# Patient Record
Sex: Female | Born: 1960 | Race: White | Hispanic: No | Smoking: Never smoker
Health system: Southern US, Community
[De-identification: ages and names within clinical notes are randomized; demographics above are authoritative.]

## PROBLEM LIST (undated history)

## (undated) DIAGNOSIS — E669 Obesity, unspecified: Secondary | ICD-10-CM

## (undated) DIAGNOSIS — Z5181 Encounter for therapeutic drug level monitoring: Secondary | ICD-10-CM

## (undated) DIAGNOSIS — G8929 Other chronic pain: Secondary | ICD-10-CM

## (undated) DIAGNOSIS — M5030 Other cervical disc degeneration, unspecified cervical region: Secondary | ICD-10-CM

## (undated) DIAGNOSIS — N63 Unspecified lump in unspecified breast: Secondary | ICD-10-CM

## (undated) HISTORY — DX: Other chronic pain: G89.29

## (undated) HISTORY — DX: Obesity, unspecified: E66.9

## (undated) HISTORY — DX: Encounter for therapeutic drug level monitoring: Z51.81

## (undated) HISTORY — DX: Other cervical disc degeneration, unspecified cervical region: M50.30

---

## 2003-02-10 ENCOUNTER — Encounter: Payer: Self-pay | Admitting: Neurosurgery

## 2003-02-10 ENCOUNTER — Encounter: Payer: Self-pay | Admitting: Anesthesiology

## 2003-02-10 ENCOUNTER — Ambulatory Visit (HOSPITAL_COMMUNITY): Admission: RE | Admit: 2003-02-10 | Discharge: 2003-02-10 | Payer: Self-pay | Admitting: Anesthesiology

## 2003-10-28 ENCOUNTER — Other Ambulatory Visit: Admission: RE | Admit: 2003-10-28 | Discharge: 2003-10-28 | Payer: Self-pay | Admitting: Obstetrics and Gynecology

## 2004-07-19 ENCOUNTER — Encounter (INDEPENDENT_AMBULATORY_CARE_PROVIDER_SITE_OTHER): Payer: Self-pay | Admitting: Specialist

## 2004-07-20 ENCOUNTER — Inpatient Hospital Stay (HOSPITAL_COMMUNITY): Admission: RE | Admit: 2004-07-20 | Discharge: 2004-07-22 | Payer: Self-pay | Admitting: Obstetrics and Gynecology

## 2006-08-21 HISTORY — PX: CERVICAL FUSION: SHX112

## 2006-11-22 ENCOUNTER — Ambulatory Visit (HOSPITAL_COMMUNITY): Admission: RE | Admit: 2006-11-22 | Discharge: 2006-11-23 | Payer: Self-pay | Admitting: Orthopaedic Surgery

## 2007-01-21 ENCOUNTER — Encounter: Admission: RE | Admit: 2007-01-21 | Discharge: 2007-01-21 | Payer: Self-pay | Admitting: Obstetrics and Gynecology

## 2009-06-28 ENCOUNTER — Encounter
Admission: RE | Admit: 2009-06-28 | Discharge: 2009-08-17 | Payer: Self-pay | Admitting: Physical Medicine & Rehabilitation

## 2009-06-29 ENCOUNTER — Ambulatory Visit: Payer: Self-pay | Admitting: Physical Medicine & Rehabilitation

## 2009-07-12 ENCOUNTER — Ambulatory Visit: Payer: Self-pay | Admitting: Physical Medicine & Rehabilitation

## 2009-07-26 ENCOUNTER — Ambulatory Visit: Payer: Self-pay | Admitting: Physical Medicine & Rehabilitation

## 2009-08-12 ENCOUNTER — Ambulatory Visit: Payer: Self-pay | Admitting: Physical Medicine & Rehabilitation

## 2009-08-23 ENCOUNTER — Encounter
Admission: RE | Admit: 2009-08-23 | Discharge: 2009-09-21 | Payer: Self-pay | Admitting: Physical Medicine & Rehabilitation

## 2009-08-24 ENCOUNTER — Ambulatory Visit: Payer: Self-pay | Admitting: Physical Medicine & Rehabilitation

## 2009-09-21 ENCOUNTER — Encounter
Admission: RE | Admit: 2009-09-21 | Discharge: 2009-12-20 | Payer: Self-pay | Admitting: Physical Medicine & Rehabilitation

## 2009-09-23 ENCOUNTER — Ambulatory Visit: Payer: Self-pay | Admitting: Physical Medicine & Rehabilitation

## 2009-10-28 ENCOUNTER — Ambulatory Visit: Payer: Self-pay | Admitting: Physical Medicine & Rehabilitation

## 2009-12-17 ENCOUNTER — Ambulatory Visit: Payer: Self-pay | Admitting: Physical Medicine & Rehabilitation

## 2010-01-06 ENCOUNTER — Encounter
Admission: RE | Admit: 2010-01-06 | Discharge: 2010-04-06 | Payer: Self-pay | Admitting: Physical Medicine & Rehabilitation

## 2010-01-12 ENCOUNTER — Ambulatory Visit: Payer: Self-pay | Admitting: Physical Medicine & Rehabilitation

## 2010-02-16 ENCOUNTER — Ambulatory Visit: Payer: Self-pay | Admitting: Physical Medicine & Rehabilitation

## 2010-02-17 ENCOUNTER — Ambulatory Visit: Payer: Self-pay | Admitting: Physical Medicine & Rehabilitation

## 2010-03-28 ENCOUNTER — Ambulatory Visit: Payer: Self-pay | Admitting: Physical Medicine & Rehabilitation

## 2010-04-04 ENCOUNTER — Encounter
Admission: RE | Admit: 2010-04-04 | Discharge: 2010-04-04 | Payer: Self-pay | Admitting: Physical Medicine & Rehabilitation

## 2010-04-28 ENCOUNTER — Encounter
Admission: RE | Admit: 2010-04-28 | Discharge: 2010-07-27 | Payer: Self-pay | Source: Home / Self Care | Admitting: Physical Medicine & Rehabilitation

## 2010-05-03 ENCOUNTER — Ambulatory Visit: Payer: Self-pay | Admitting: Physical Medicine & Rehabilitation

## 2010-05-31 ENCOUNTER — Ambulatory Visit: Payer: Self-pay | Admitting: Physical Medicine & Rehabilitation

## 2010-06-28 ENCOUNTER — Ambulatory Visit: Payer: Self-pay | Admitting: Physical Medicine & Rehabilitation

## 2010-08-24 ENCOUNTER — Encounter
Admission: RE | Admit: 2010-08-24 | Discharge: 2010-09-20 | Payer: Self-pay | Source: Home / Self Care | Attending: Physical Medicine & Rehabilitation | Admitting: Physical Medicine & Rehabilitation

## 2010-08-26 ENCOUNTER — Ambulatory Visit
Admission: RE | Admit: 2010-08-26 | Discharge: 2010-08-26 | Payer: Self-pay | Source: Home / Self Care | Attending: Physical Medicine & Rehabilitation | Admitting: Physical Medicine & Rehabilitation

## 2010-09-19 ENCOUNTER — Other Ambulatory Visit: Payer: Self-pay | Admitting: Physical Medicine & Rehabilitation

## 2010-09-20 ENCOUNTER — Other Ambulatory Visit: Payer: Self-pay | Admitting: Physical Medicine & Rehabilitation

## 2010-09-20 DIAGNOSIS — M25551 Pain in right hip: Secondary | ICD-10-CM

## 2010-09-21 ENCOUNTER — Encounter: Payer: Self-pay | Admitting: Physical Medicine & Rehabilitation

## 2010-09-21 ENCOUNTER — Ambulatory Visit
Admission: RE | Admit: 2010-09-21 | Discharge: 2010-09-21 | Disposition: A | Discharge status: Home / Self Care | Payer: Medicaid Other | Source: Ambulatory Visit | Attending: Physical Medicine & Rehabilitation | Admitting: Physical Medicine & Rehabilitation

## 2010-09-21 DIAGNOSIS — M25551 Pain in right hip: Secondary | ICD-10-CM

## 2010-09-28 ENCOUNTER — Encounter: Payer: Medicaid Other | Attending: Physical Medicine & Rehabilitation

## 2010-09-28 ENCOUNTER — Ambulatory Visit (HOSPITAL_BASED_OUTPATIENT_CLINIC_OR_DEPARTMENT_OTHER): Payer: Medicaid Other

## 2010-09-28 DIAGNOSIS — M25559 Pain in unspecified hip: Secondary | ICD-10-CM

## 2010-09-28 DIAGNOSIS — M545 Low back pain: Secondary | ICD-10-CM

## 2010-09-28 DIAGNOSIS — R209 Unspecified disturbances of skin sensation: Secondary | ICD-10-CM | POA: Insufficient documentation

## 2010-09-28 DIAGNOSIS — M5137 Other intervertebral disc degeneration, lumbosacral region: Secondary | ICD-10-CM

## 2010-09-28 DIAGNOSIS — IMO0001 Reserved for inherently not codable concepts without codable children: Secondary | ICD-10-CM | POA: Insufficient documentation

## 2010-09-28 DIAGNOSIS — IMO0002 Reserved for concepts with insufficient information to code with codable children: Secondary | ICD-10-CM | POA: Insufficient documentation

## 2010-09-28 DIAGNOSIS — M47817 Spondylosis without myelopathy or radiculopathy, lumbosacral region: Secondary | ICD-10-CM

## 2010-09-28 DIAGNOSIS — M542 Cervicalgia: Secondary | ICD-10-CM | POA: Insufficient documentation

## 2010-09-28 DIAGNOSIS — M961 Postlaminectomy syndrome, not elsewhere classified: Secondary | ICD-10-CM | POA: Insufficient documentation

## 2010-10-31 ENCOUNTER — Encounter: Payer: Medicaid Other | Attending: Physical Medicine & Rehabilitation

## 2010-10-31 ENCOUNTER — Ambulatory Visit (HOSPITAL_BASED_OUTPATIENT_CLINIC_OR_DEPARTMENT_OTHER): Payer: Medicaid Other | Admitting: Physical Medicine & Rehabilitation

## 2010-10-31 DIAGNOSIS — M48061 Spinal stenosis, lumbar region without neurogenic claudication: Secondary | ICD-10-CM

## 2010-10-31 DIAGNOSIS — IMO0001 Reserved for inherently not codable concepts without codable children: Secondary | ICD-10-CM | POA: Insufficient documentation

## 2010-10-31 DIAGNOSIS — M542 Cervicalgia: Secondary | ICD-10-CM | POA: Insufficient documentation

## 2010-10-31 DIAGNOSIS — M961 Postlaminectomy syndrome, not elsewhere classified: Secondary | ICD-10-CM | POA: Insufficient documentation

## 2010-10-31 DIAGNOSIS — R209 Unspecified disturbances of skin sensation: Secondary | ICD-10-CM | POA: Insufficient documentation

## 2010-10-31 DIAGNOSIS — IMO0002 Reserved for concepts with insufficient information to code with codable children: Secondary | ICD-10-CM | POA: Insufficient documentation

## 2010-10-31 DIAGNOSIS — M25559 Pain in unspecified hip: Secondary | ICD-10-CM

## 2010-11-29 ENCOUNTER — Encounter: Payer: Medicare Other | Attending: Neurosurgery | Admitting: Neurosurgery

## 2010-11-29 ENCOUNTER — Ambulatory Visit: Payer: Medicaid Other | Admitting: Neurosurgery

## 2010-11-29 DIAGNOSIS — M542 Cervicalgia: Secondary | ICD-10-CM

## 2010-11-29 DIAGNOSIS — G894 Chronic pain syndrome: Secondary | ICD-10-CM | POA: Insufficient documentation

## 2010-11-29 DIAGNOSIS — M545 Low back pain, unspecified: Secondary | ICD-10-CM | POA: Insufficient documentation

## 2010-11-29 DIAGNOSIS — Z981 Arthrodesis status: Secondary | ICD-10-CM | POA: Insufficient documentation

## 2010-11-29 DIAGNOSIS — R5381 Other malaise: Secondary | ICD-10-CM | POA: Insufficient documentation

## 2010-11-29 DIAGNOSIS — IMO0002 Reserved for concepts with insufficient information to code with codable children: Secondary | ICD-10-CM | POA: Insufficient documentation

## 2010-11-29 DIAGNOSIS — F341 Dysthymic disorder: Secondary | ICD-10-CM | POA: Insufficient documentation

## 2010-11-29 DIAGNOSIS — M5430 Sciatica, unspecified side: Secondary | ICD-10-CM

## 2010-11-29 DIAGNOSIS — R5383 Other fatigue: Secondary | ICD-10-CM | POA: Insufficient documentation

## 2010-11-29 DIAGNOSIS — R209 Unspecified disturbances of skin sensation: Secondary | ICD-10-CM | POA: Insufficient documentation

## 2010-12-13 ENCOUNTER — Ambulatory Visit (HOSPITAL_COMMUNITY)
Admission: RE | Admit: 2010-12-13 | Discharge: 2010-12-13 | Disposition: A | Discharge status: Home / Self Care | Payer: Medicare Other | Source: Ambulatory Visit | Attending: Physical Medicine & Rehabilitation | Admitting: Physical Medicine & Rehabilitation

## 2010-12-13 ENCOUNTER — Other Ambulatory Visit: Payer: Self-pay | Admitting: Physical Medicine & Rehabilitation

## 2010-12-13 DIAGNOSIS — R52 Pain, unspecified: Secondary | ICD-10-CM

## 2010-12-13 DIAGNOSIS — M25559 Pain in unspecified hip: Secondary | ICD-10-CM | POA: Insufficient documentation

## 2010-12-29 ENCOUNTER — Encounter: Payer: Medicare Other | Attending: Neurosurgery | Admitting: Neurosurgery

## 2010-12-29 DIAGNOSIS — M5430 Sciatica, unspecified side: Secondary | ICD-10-CM

## 2010-12-29 DIAGNOSIS — IMO0001 Reserved for inherently not codable concepts without codable children: Secondary | ICD-10-CM | POA: Insufficient documentation

## 2010-12-29 DIAGNOSIS — M48061 Spinal stenosis, lumbar region without neurogenic claudication: Secondary | ICD-10-CM

## 2010-12-29 DIAGNOSIS — M7989 Other specified soft tissue disorders: Secondary | ICD-10-CM | POA: Insufficient documentation

## 2010-12-29 DIAGNOSIS — M545 Low back pain, unspecified: Secondary | ICD-10-CM | POA: Insufficient documentation

## 2010-12-29 DIAGNOSIS — G894 Chronic pain syndrome: Secondary | ICD-10-CM

## 2010-12-29 DIAGNOSIS — M533 Sacrococcygeal disorders, not elsewhere classified: Secondary | ICD-10-CM | POA: Insufficient documentation

## 2010-12-29 DIAGNOSIS — M79609 Pain in unspecified limb: Secondary | ICD-10-CM | POA: Insufficient documentation
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
## 2011-01-02 ENCOUNTER — Other Ambulatory Visit: Payer: Self-pay | Admitting: Physical Medicine & Rehabilitation

## 2011-01-02 DIAGNOSIS — M545 Low back pain, unspecified: Secondary | ICD-10-CM

## 2011-01-02 DIAGNOSIS — M25559 Pain in unspecified hip: Secondary | ICD-10-CM

## 2011-01-02 DIAGNOSIS — M79609 Pain in unspecified limb: Secondary | ICD-10-CM

## 2011-01-02 DIAGNOSIS — M5410 Radiculopathy, site unspecified: Secondary | ICD-10-CM

## 2011-01-06 NOTE — Op Note (Signed)
Mallory Leonard, Mallory Leonard               ACCOUNT NO.:  [id number]   MEDICAL RECORD NO.:  [id number]          PATIENT TYPE:  OBV   LOCATION:  9399                          FACILITY:  [facility]   PHYSICIAN:  Miguel Aschoff, M.D.       DATE OF BIRTH:  02/25/1961   DATE OF PROCEDURE:  07/19/2004  DATE OF DISCHARGE:                                 OPERATIVE REPORT   PREOPERATIVE DIAGNOSES:  1.  Chronic pelvic pain.  2.  Uterine fibroids.  3.  History of endometriosis.   POSTOPERATIVE DIAGNOSES:  1.  Chronic pelvic pain.  2.  Uterine fibroids.  3.  History of endometriosis.   PROCEDURES:  1.  Examination under anesthesia.  2.  Total abdominal hysterectomy.  3.  Lysis of adhesions.   SURGEON:  Miguel Aschoff, M.D.   ANESTHESIA:  General.   COMPLICATIONS:  None.   BRIEF HISTORY:  The patient is a 50 year old white female with a history of  dysmenorrhea, dyspareunia, uterine fibroids, and a previous history of  endometriosis.  Because of her symptomatology, the patient had initially  been treated with Lupron Depot therapy in an effort to control her symptoms;  however, after completion of the therapy, she had recurrence of her severe  pelvic pain, dyspareunia, and dysmenorrhea, and requested a definitive  procedure to be carried out.  Ultrasound examination prior to surgery  revealed the presence of uterine fibroids, and the plan is for the patient  to undergo evaluation under anesthesia and if it is felt to proceed with  LAVH, this will be attempted.  If not, a total abdominal hysterectomy and  possible bilateral salpingo-oophorectomy will be performed.  All options  were discussed with the patient concerning the route and extent of the  surgery.  Risks and benefits have been discussed.  Informed consent has been  obtained.   PROCEDURE:  The patient was taken to the operating room and placed in the  supine position, and general anesthesia was administered without difficulty.  She was then placed  in modified lithotomy position, and examination under  anesthesia was carried out.  This revealed a globular uterus approximately  10 weeks' equivalent size.  No definite adnexal masses were noted.  The  uterus was relatively immobile and because of the examination under  anesthesia, it was felt best to proceed with abdominal hysterectomy rather  than laparoscopically-assisted hysterectomy.  At this point the patient was  placed in the supine position, a Foley catheter was inserted, then a  Pfannenstiel incision was made, extended down through the subcutaneous  tissue with bleeding points being clamped and coagulated as they were  encountered.  The fascia was then identified, incised transversely, and then  separated from the underlying rectus muscles.  Rectus muscles were divided  in the midline.  The peritoneum was the found and entered, carefully  avoiding underlying structures.  The peritoneal incision was then extended  under direct visualization.  A self-retaining retractor was placed through  the wound, viscera were packed out of the pelvis.  At this point, inspection  of the pelvic organs again revealed  the uterus to be globular, approximately  10 weeks' equivalent size.  There was a small subserosal myoma present.  There were adhesions of the sigmoid colon to the posterior surface of the  uterus.  These were mostly filmy in nature.  The ovaries appeared to be  within normal limits.  The cul-de-sac was inspected, and there was no  endometriosis noted in the cul-de-sac.  A small left paratubal cyst was  noted.  At this point the round ligaments were identified, clamped, cut, and  suture ligated using suture ligatures of 0 Vicryl.  A bladder flap was then  created anteriorly.  Then preparations were made below the utero-ovarian  ligaments.  These ligaments were clamped with curved Heaney clamps, pedicles  cut and suture ligated using suture ligatures of 0 Vicryl.  Then the broad   ligament was skeletonized, the uterine vessels identified, and these were  then clamped with curved Heaney clamps.  These pedicles were cut and suture  ligated using suture ligatures of 0 Vicryl.  Then in serial fashion using  straight Heaney clamps, the paracervical fascia was clamped, cut, and suture  ligated.  This continued until the uterosacral ligaments were clamped with  curved Heaney clamps.  These were cut and suture ligated using suture  ligatures of 0 Vicryl.  Then the cardinal ligaments were clamped, cut, and  suture ligated using suture ligatures of 0 Vicryl.  At this point it was  possible to clamp across the reflection of the vagina onto the cervix, and  the specimen was excised, specimen consisting of the cervix and the uterus.  At this point figure-of-eight sutures were placed in the angles of the  vaginal cuff.  Then the cuff was closed using running interlocking 0 Vicryl  suture.  Inspection made for hemostasis.  Hemostasis appeared to be  excellent.  At this point the pelvic floor was reperitonealized and the  ovaries were elevated and sutured to the remnant of the round ligament to  try to keep the ovaries out of the area of the vaginal cuff.  After this was  done, the abdomen was irrigated with warm saline and lap counts and  instrument counts were taken and found to be correct, and then the abdomen  was closed.  The parietal peritoneum was closed using running continuous 0  Vicryl suture.  Rectus muscles were reapproximated using running continuous  0 Vicryl suture.  The fascia was closed using two sutures of 0 Vicryl, each  starting at the lateral fascial angles and meeting in the midline.  Subcutaneous tissue and skin were closed using staples.  The estimated blood  loss from the procedure was approximately 100 mL.  The patient tolerated the  procedure well and went to the recovery room in satisfactory condition.      AR/MEDQ  D:  07/19/2004  T:  07/19/2004  Job:   [id number]

## 2011-01-06 NOTE — H&P (Signed)
Mallory Leonard, Mallory Leonard               ACCOUNT NO.:  [id number]   MEDICAL RECORD NO.:  [id number]          PATIENT TYPE:  OIB   LOCATION:  5028                         FACILITY:  [facility]   PHYSICIAN:  Sharolyn Douglas, M.D.        DATE OF BIRTH:  [birth year]   DATE OF ADMISSION:  11/22/2006  DATE OF DISCHARGE:  11/23/2006                              HISTORY & PHYSICAL   CHIEF COMPLAINT:  Neck and right upper extremity pain.   HISTORY OF PRESENT ILLNESS:  The patient is a 50 year old-female who has  been having increasing neck and right upper extremity pain.  She  unfortunately has not responded to conservative treatments.  She was  found to have a cervical spondylosis from C5-C7.  The risks and benefits  of the above ACDF were discussed with the patient at length by Dr. Noel Gerold  as well as myself.  She indicated understanding and opted to proceed.   ALLERGIES:  NONE.   MEDICATIONS:  Norco 10 mg.   PAST MEDICAL HISTORY:  Cervical spondylosis.   PAST SURGICAL HISTORY:  Hysterectomy and right breast lift and  appendectomy.   SOCIAL HISTORY:  The patient denies tobacco use.  Denies alcohol use.  She is divorced.   FAMILY HISTORY:  Noncontributory.   REVIEW OF SYSTEMS:  Negative.   PHYSICAL EXAMINATION:  GENERAL APPEARANCE:  The patient is a 50 year old  white female who is alert and oriented in no acute distress.  Well-  nourished, well-groomed, appeared stated age.  Pleasant and cooperative  to this exam.  HEENT:  Head is normocephalic, atraumatic.  Pupils equal, round,  reactive to light.  Extraocular movements are intact.  Nares patent.  Oropharynx clear.  NECK:  Soft to palpation.  No lymphadenopathy, thyromegaly.  No bruits  appreciated.  CHEST:  Clear to auscultation bilaterally.  No rales, rhonchi, stridor,  wheezes, or friction rubs.  HEART:  S1, S2.  Regular rate and rhythm.  No murmurs, gallops, or rubs  noted.  ABDOMEN:  Soft to palpation, nontender, nondistended.  No  organomegaly.  GU:  Not pertinent; not performed.  EXTREMITIES:  As per HPI.  SKIN:  Intact.  No lesions or rashes.   IMPRESSION:  Cervical spondylosis.   PLAN:  Admit to [facility] on November 22, 2006, for ACDF of C5-C7.  Surgery will be done by Dr. Noel Gerold.      Verlin Fester, P.A.      Sharolyn Douglas, M.D.  Electronically Signed    CM/MEDQ  D:  11/28/2006  T:  11/28/2006  Job:  [id number]

## 2011-01-06 NOTE — Discharge Summary (Signed)
[name], [name]               ACCOUNT NO.:  [id number]   MEDICAL RECORD NO.:  [id number]          PATIENT TYPE:  INP   LOCATION:  9320                          FACILITY:  [facility]   PHYSICIAN:  Miguel Aschoff, M.D.       DATE OF BIRTH:  1961/03/30   DATE OF ADMISSION:  07/19/2004  DATE OF DISCHARGE:  07/22/2004                                 DISCHARGE SUMMARY   ADMISSION DIAGNOSES:  1.  Chronic pelvic pain.  2.  Endometriosis.   FINAL DIAGNOSES:  1.  Chronic pelvic pain.  2.  Endometriosis.  3.  Leiomyomata.  4.  Serosal fibrous adhesions.   OPERATIONS AND PROCEDURES:  Total abdominal hysterectomy.   BRIEF HISTORY:  The patient is a 50 year old white female with history of  increasing dysmenorrhea and dyspareunia as well as uterine fibroids.  She  does have a previous history of endometriosis.  The patient had been treated  conservatively with Lupron Depot.  However, on completion of her Lupron  therapy the patient had a recurrence of her severe pelvic pain, dyspareunia,  and dysmenorrhea.  Because of these symptoms, she now requests a definitive  procedure be carried out and was admitted to the hospital at this time to  undergo total abdominal hysterectomy, possible bilateral salpingo-  oophorectomy.  Preoperative studies were obtained.  This included admission  hemoglobin of 13.2, hematocrit of 8.5.  PT and PTT were within normal  limits.  Chemistry profile was also within normal limits.   HOSPITAL COURSE:  On July 19, 2004 the patient was taken to the  operating room where under general anesthesia an examination was carried out  and it was felt at that time that rather than proceed with laparoscopic-  assisted hysterectomy that total abdominal hysterectomy was indicated by the  clinical examination.  This was carried out and revealed a globular uterus  approximately 8 weeks in size.  Ovaries appear to be normal.  In addition,  she had pelvic adhesions.  The hysterectomy  was carried out without  difficulty, as well as the lysis of adhesions, and the patient tolerated the  procedure well.  Postoperative course was complicated by a drop in her  hemoglobin to 7.5; however, the patient remained clinically stable and this  was treated with iron therapy.  By postoperative day #3, the patient was in  satisfactory condition and felt stable enough to be discharged home.  Medications for home included Tylox one q.3h. as needed for pain, Chromagen  iron tablets one daily.  She was instructed to do no heavy lifting; to place  nothing in the vagina; and to call if there are any problems such as fever,  pain or heavy bleeding.  She was sent  home on a regular diet in satisfactory condition.  Final pathology report  revealed a 187-gram uterus with a nonsecretory endometrium, a leiomyomata,  serous fibrous adhesions.  The patient is to be seen back in 4 weeks for  follow-up examination and she understands to call if there are any  postoperative problems.      AR/MEDQ  D:  08/15/2004  T:  08/15/2004  Job:  [id number]

## 2011-01-06 NOTE — Op Note (Signed)
Mallory Leonard, Mallory Leonard               ACCOUNT NO.:  [id number]   MEDICAL RECORD NO.:  [id number]          PATIENT TYPE:  AMB   LOCATION:  SDS                          FACILITY:  [facility]   PHYSICIAN:  Sharolyn Douglas, M.D.        DATE OF BIRTH:  09-Jul-1961   DATE OF PROCEDURE:  11/22/2006  DATE OF DISCHARGE:                               OPERATIVE REPORT   DIAGNOSIS:  Cervical spondylotic radiculopathy.   PROCEDURE:  1. Anterior cervical diskectomy C5-6 and C6-7 with decompression of      the spinal cord and nerve roots bilaterally.  2. Anterior cervical arthrodesis C5-6 and C6-7 with placement of two      allograft prosthesis spacers packed with local autogenous bone      graft.  3. Anterior cervical plating C5-C7 using the Abbott spine system.   SURGEON:  Sharolyn Douglas, M.D.   ASSISTANT:  Verlin Fester, P.A.   ANESTHESIA:  General endotracheal.   ESTIMATED BLOOD LOSS:  Minimal.   COMPLICATIONS:  None.   INDICATIONS:  The patient is a pleasant 50 year old female with chronic  persistent neck and primarily right greater than left upper extremity  pain.  Imaging studies show severe cervical spondylosis and osteophyte  formation worse on the right side at C5-6 and C6-7 with foraminal  narrowing.  She now presents for ACDF at the above levels in the hopes  of improving her symptoms.  Risk, benefits, and alternatives were  reviewed.   DESCRIPTION OF PROCEDURE:  After informed consent she was taken to the  operating room.  She underwent general endotracheal anesthesia without  difficulty.  She was given prophylactic IV antibiotics.  She was  carefully positioned supine on the operating room table with the  Mayfield head rest; and the neck in slight extension.  The neck was  prepped and draped in the usual sterile fashion.  A small transverse  incision was made on the left of the neck at the level cricoid  cartilage.  Dissection was carried sharply through the platysma; and the  area between  the SCM and strap muscles medially was developed down the  prevertebral space.  The esophagus, trachea, and carotid sheath were  identified and protected at all times.  A spinal needle was placed at C6-  7 and intraoperative x-ray taken to confirm this level.   We then exposed the C5-6 and C6-7 levels by elevating the longus coli  muscle.  The shadow line retractor was placed.  The microscope was  draped and brought into the field.  Caspar distraction pins were placed  in the C5, C6, and C7 vertebral bodies.  Gentle distraction was applied.  Starting at C5-6 a radical diskectomy was carried back into the  posterior longitudinal ligament.  The disk was degenerative.  The  cartilaginous endplates were prepared for the arthrodesis.  The high-  speed bur was used to take down the posterior vertebral margins as well  as the uncovertebral joints.  This bone was saved for later  autografting.  Then a 2-mm Kerrison punch used to take down the  posterior  longitudinal ligament.  We then used the 1-mm Kerrison punch  to undercut and remove the osteophytes primarily on the right side.  We  found the foramen was stenotic; and this was opened up.  We also  performed a foraminotomy on the left side.  We then placed a 5-mm, and a  5-mm allograft prosthesis spacer which had been packed with the local  autogenous bone graft collected earlier.  This was countersunk 1 mm.  We  then performed a similar procedure at C6-7.  Again the posterior  vertebral margins were taken down, the uncovertebral joints were  debrided, and the foraminotomies were completed.  At this level.  We  utilized a 6-mm allograft prosthesis spacer, again, packed with the  local autogenous bone graft.  We placed a 38-mm anterior cervical plate  from Z6-X0.  We used six 12-mm screws.  The bone quality was good, and  the screw purchase was adequate.  We ensured that the locking mechanism  engaged.  The esophagus, trachea, and carotid sheath  were examined.  There were no apparent injuries.  Hemostasis was achieved.  Intraoperative x-ray was taken showing good position of the  instrumentation from C5-C7.  A TLS drain was left in place.  The  platysma was closed with interrupted 2-0 Vicryl, subcutaneous layer  closed with interrupted 3-0 Vicryl, followed by a running 4-0  subcuticular Vicryl suture on the skin edges.  Dermabond was applied.  The cervical collar placed.  The patient was extubated without  difficulty, and transferred to recovery in stable condition.   It should be noted my assistant [facility], PA was present throughout  procedure including the exposure, the decompression, the fusion, the  instrumentation; and she also assisted with wound closure.      Sharolyn Douglas, M.D.  Electronically Signed     MC/MEDQ  D:  11/22/2006  T:  11/22/2006  Job:  [id number]

## 2011-01-07 ENCOUNTER — Ambulatory Visit (HOSPITAL_COMMUNITY)
Admission: RE | Admit: 2011-01-07 | Discharge: 2011-01-07 | Disposition: A | Discharge status: Home / Self Care | Payer: Medicare Other | Source: Ambulatory Visit | Attending: Physical Medicine & Rehabilitation | Admitting: Physical Medicine & Rehabilitation

## 2011-01-07 DIAGNOSIS — M129 Arthropathy, unspecified: Secondary | ICD-10-CM | POA: Insufficient documentation

## 2011-01-07 DIAGNOSIS — N83209 Unspecified ovarian cyst, unspecified side: Secondary | ICD-10-CM | POA: Insufficient documentation

## 2011-01-07 DIAGNOSIS — M545 Low back pain, unspecified: Secondary | ICD-10-CM | POA: Insufficient documentation

## 2011-01-07 DIAGNOSIS — M51379 Other intervertebral disc degeneration, lumbosacral region without mention of lumbar back pain or lower extremity pain: Secondary | ICD-10-CM | POA: Insufficient documentation

## 2011-01-07 DIAGNOSIS — R262 Difficulty in walking, not elsewhere classified: Secondary | ICD-10-CM | POA: Insufficient documentation

## 2011-01-07 DIAGNOSIS — M25559 Pain in unspecified hip: Secondary | ICD-10-CM | POA: Insufficient documentation

## 2011-01-07 DIAGNOSIS — M79609 Pain in unspecified limb: Secondary | ICD-10-CM

## 2011-01-07 DIAGNOSIS — M5137 Other intervertebral disc degeneration, lumbosacral region: Secondary | ICD-10-CM | POA: Insufficient documentation

## 2011-01-07 DIAGNOSIS — M76899 Other specified enthesopathies of unspecified lower limb, excluding foot: Secondary | ICD-10-CM | POA: Insufficient documentation

## 2011-01-30 ENCOUNTER — Encounter: Payer: Medicare Other | Attending: Neurosurgery | Admitting: Neurosurgery

## 2011-01-30 DIAGNOSIS — M542 Cervicalgia: Secondary | ICD-10-CM | POA: Insufficient documentation

## 2011-01-30 DIAGNOSIS — M545 Low back pain, unspecified: Secondary | ICD-10-CM | POA: Insufficient documentation

## 2011-01-30 DIAGNOSIS — M5430 Sciatica, unspecified side: Secondary | ICD-10-CM

## 2011-01-30 DIAGNOSIS — M25519 Pain in unspecified shoulder: Secondary | ICD-10-CM | POA: Insufficient documentation

## 2011-01-30 DIAGNOSIS — G8929 Other chronic pain: Secondary | ICD-10-CM | POA: Insufficient documentation

## 2011-01-30 DIAGNOSIS — M25559 Pain in unspecified hip: Secondary | ICD-10-CM

## 2011-01-31 NOTE — Assessment & Plan Note (Signed)
[name] returns today, is the patient of Dr. Pamelia Hoit, is following up for some low back pain.  She also has some shoulder pain and neck pain.  She tells me that she is tired of taking the Opana that she has been on for sometime and she wants to wean off of that.  She does not want anymore pain medicines.  I told her I felt that was admirable and she can do so if she should.  She rates her pain on an average about 10, it was constant, and it is in her back and neck and then right hip and buttocks.  Activity level is very little.  Sleep patterns are poor. Pain is worse in the morning and night.  Most all activities aggravate, medication tend to help a little, pacing activities help.  She walks without assistance.  She only walk about 15 minutes.  She can drive and climb stairs.  REVIEW OF SYSTEMS:  Notable for those difficulties as well as some constipation, some limb swelling.  She has some confusion, depression, anxiety at times.  No signs of aberrant behavior.  Her Oswestry score today is 70.  PAST MEDICAL HISTORY:  Unchanged.  SOCIAL HISTORY:  Unchanged.  PHYSICAL EXAMINATION:  VITAL SIGNS:  Blood pressure 157/81, pulse 73, respirations 18, O2 sats 98 on room air. MUSCULOSKELETAL:  Her motor strength is good in the lower extremities, unchanged.  Sensation is intact.  She does have pain on palpation to the neck and back. PSYCHIATRIC:  Constitutionally, she is within normal limits.  She is oriented x3.  She is bright and alert.  Does seem somewhat depressed.  ASSESSMENT:  Chronic low back and neck pain with right radicular pain in the shoulder as well as the hip.  RADIOGRAPHS:  We did obtain an MRI of the right hip that was negative for any acute findings, mild degeneration of both hips.  We also obtained an MRI of lumbar spine that again shows some multilevel pathology with a 2-3 foraminal protrusion that could irritate L2 nerve root.  I discussed this with the patient,  her questions are referred to neurosurgeon.  PLAN: 1. We will go ahead and refer her to Dr. Delma Officer, at [facility]. 2. I discussed with her weaning off her Opana going from twice a day,     for this week twice 1 day.  Once the next and rotate like it     through this weekend and then next week go to one a day for a week     and then come off of it or either go to the third week to one every     other day.  She does understand she will have some withdrawal, she     understands this.  Her questions were encouraged and answered.  We     will see her back here in 3 months.     Vrinda Heckstall L. Blima Dessert Electronically Signed    RLW/MedQ D:  01/30/2011 14:15:29  T:  01/31/2011 05:18:25  Job #:  [id number]

## 2011-03-06 ENCOUNTER — Other Ambulatory Visit: Payer: Self-pay | Admitting: Neurosurgery

## 2011-03-06 DIAGNOSIS — M542 Cervicalgia: Secondary | ICD-10-CM

## 2011-03-13 ENCOUNTER — Encounter: Payer: Medicare Other | Attending: Neurosurgery | Admitting: Neurosurgery

## 2011-03-13 ENCOUNTER — Ambulatory Visit: Payer: Medicare Other | Admitting: Physical Medicine and Rehabilitation

## 2011-03-13 DIAGNOSIS — M542 Cervicalgia: Secondary | ICD-10-CM | POA: Insufficient documentation

## 2011-03-13 DIAGNOSIS — M25519 Pain in unspecified shoulder: Secondary | ICD-10-CM | POA: Insufficient documentation

## 2011-03-13 DIAGNOSIS — M545 Low back pain, unspecified: Secondary | ICD-10-CM | POA: Insufficient documentation

## 2011-03-13 DIAGNOSIS — R209 Unspecified disturbances of skin sensation: Secondary | ICD-10-CM | POA: Insufficient documentation

## 2011-03-13 DIAGNOSIS — M25559 Pain in unspecified hip: Secondary | ICD-10-CM | POA: Insufficient documentation

## 2011-03-14 NOTE — Assessment & Plan Note (Signed)
[name] is a patient of Dr. Wynn Banker, is followed for multiple pain complaints.  She states she has got no new problems.  She did talk to her psychologist about her discussion with me in coming off her pain medicine.  She states that she was encouraged to not put herself through that and she wishes to switch back to the Opana from hydrocodone.  She rates her pain about a 10, it is constant pain.  It is stabbing and aching with spasms in her low back up to her neck.  General activity level is 10.  Pain same 24 hours a day.  Sleep patterns are poor.  All activities aggravate.  Pacing activities, medication tend to help.  She walks without assistance.  She can drive and climb steps.  She can walk about 15 minutes at a time.  She is on disability.  REVIEW OF SYSTEMS:  Notable for the difficulties described above as well as weight loss, night sweats, limb swelling, weakness, paresthesias, trouble with ambulation, confusion, depression, or anxiety.  No suicidal thoughts or aberrant behaviors.  PAST MEDICAL HISTORY:  Unchanged.  SOCIAL HISTORY:  She is married, single and lives alone.  FAMILY HISTORY:  Unchanged.  PHYSICAL EXAMINATION:  VITAL SIGNS:  Blood pressure 156/93, pulse 92, respirations 18, and O2 saturation is 98% on room air. CONSTITUTIONAL:  She is within normal limits. NEUROLOGIC:  Her affect is bright and alert.  She does walk with somewhat of a limp.  Her motor strength is fair in lower extremities about 4/5.  Sensation is intact.  Again, there are new problems. Oswestry score is 72.  ASSESSMENT: 1. Chronic low back pain. 2. Cervicalgia. 3. Right radicular pain in the shoulders as well as hip.  PLAN: 1. We discussed her appointment Dr. Lovell Sheehan at [facility].  He does not have anything to offer surgically in the lumbar     spine.  He is going to obtain MRI of the cervical spine to see her     back.  She will follow up with him regarding that. 2.  Opana 10 mg ER one p.o. b.i.d., #60 with no refill.  We will     discontinue the hydrocodone 5/325 one p.o. q.8 h.  She will finish     her prescription of that and then restart the Opana.  Her questions     were encouraged and answered.  We will see her back in 1 month.     Griffon Herberg L. Blima Dessert Electronically Signed    RLW/MedQ D:  03/13/2011 15:41:30  T:  03/14/2011 02:56:37  Job #:  [id number]

## 2011-03-20 ENCOUNTER — Ambulatory Visit
Admission: RE | Admit: 2011-03-20 | Discharge: 2011-03-20 | Disposition: A | Discharge status: Home / Self Care | Payer: Medicare Other | Source: Ambulatory Visit | Attending: Neurosurgery | Admitting: Neurosurgery

## 2011-03-20 DIAGNOSIS — M542 Cervicalgia: Secondary | ICD-10-CM

## 2011-04-10 ENCOUNTER — Ambulatory Visit: Payer: Medicare Other | Admitting: Neurosurgery

## 2011-04-17 ENCOUNTER — Encounter: Payer: Medicare Other | Admitting: Neurosurgery

## 2011-04-21 ENCOUNTER — Encounter: Payer: Medicare Other | Attending: Neurosurgery | Admitting: Neurosurgery

## 2011-04-21 DIAGNOSIS — G894 Chronic pain syndrome: Secondary | ICD-10-CM

## 2011-04-21 DIAGNOSIS — M545 Low back pain, unspecified: Secondary | ICD-10-CM | POA: Insufficient documentation

## 2011-04-21 DIAGNOSIS — IMO0002 Reserved for concepts with insufficient information to code with codable children: Secondary | ICD-10-CM | POA: Insufficient documentation

## 2011-04-21 DIAGNOSIS — M25519 Pain in unspecified shoulder: Secondary | ICD-10-CM | POA: Insufficient documentation

## 2011-04-21 DIAGNOSIS — M542 Cervicalgia: Secondary | ICD-10-CM | POA: Insufficient documentation

## 2011-04-21 DIAGNOSIS — M25559 Pain in unspecified hip: Secondary | ICD-10-CM | POA: Insufficient documentation

## 2011-04-21 NOTE — Assessment & Plan Note (Signed)
Account [id number].  Mallory Leonard is a patient Dr. Wynn Banker who is seen for multiple pain complaints as well as shoulder and neck pain, hip pain, and leg pain. She reports no change today except for she is having some increased leg and arm cramping.  She states she had some rectal bleeding from time to time.  She had a colonoscopy that was basically negative, and she has to go back for formal followup on that next week.  She also states that her primary care doctor needs to do some blood work which I reiterated to her to check her electrolyte levels and general blood counts to make sure that she is not low and this is cause her cramping.  She rates her pain at 9, it is intermittent, it is a tingling, aching type pain. General activity level is 10.  Pain is worse in the evening and at night.  Sleep patterns are poor.  All activities aggravate her pain, medication, TENS unit tends to help.  She can walk about 15 minutes at a time.  Functionally, she is unemployed.  REVIEW OF SYSTEMS:  Notable for those difficulties described above as well as some constipation, nausea, limb swelling, confusion, depression, anxiety, no suicidal thoughts, or aberrant behaviors.  She is having some night sweats and weight gain which may be contributed to by menopause.  Her past medical history is unchanged.  SOCIAL HISTORY:  She is single, lives alone.  FAMILY HISTORY:  Unchanged.  PHYSICAL EXAMINATION:  Her blood pressure is 168/101, pulse 75, respirations 18, O2 sats 100 on room air.  Motor strength is 5/5 in iliopsoas, quadriceps, confrontational testing.  Sensation is intact in the upper and lower extremities.  Constitutionally she is within normal limits.  She is alert and oriented x3.  She has a normal gait, although she does limp somewhat when she is up and about.  ASSESSMENT: 1. Chronic low back pain. 2. Cervicalgia. 3. Right radicular pain.  She also has shoulder pain as well as some     hip pain  that is transient.  PLAN: 1. She will follow up with her GI and primary care regarding her     bleeding issues. 2. Refill Opana ER 10 mg one p.o. t.i.d. 60 with no refill. 3. She will follow up here in the clinic in 1 month, her questions     were encouraged and answered.     Rickey Farrier L. Blima Dessert Electronically Signed    RLW/MedQ D:  04/21/2011 13:01:38  T:  04/21/2011 19:20:34  Job #:  [id number]

## 2011-05-17 ENCOUNTER — Encounter: Payer: Medicare Other | Admitting: Neurosurgery

## 2011-05-19 ENCOUNTER — Ambulatory Visit: Payer: Medicare Other | Admitting: Neurosurgery

## 2011-05-29 ENCOUNTER — Encounter: Payer: Medicare Other | Attending: Neurosurgery | Admitting: Neurosurgery

## 2011-05-29 DIAGNOSIS — M545 Low back pain, unspecified: Secondary | ICD-10-CM | POA: Insufficient documentation

## 2011-05-29 DIAGNOSIS — M79609 Pain in unspecified limb: Secondary | ICD-10-CM | POA: Insufficient documentation

## 2011-05-29 DIAGNOSIS — G894 Chronic pain syndrome: Secondary | ICD-10-CM

## 2011-05-29 DIAGNOSIS — M542 Cervicalgia: Secondary | ICD-10-CM

## 2011-05-29 DIAGNOSIS — G8929 Other chronic pain: Secondary | ICD-10-CM | POA: Insufficient documentation

## 2011-05-30 NOTE — Assessment & Plan Note (Signed)
This is a patient of Dr. Wynn Banker, seen for multiple pain complaints mostly in her back and right lower extremity pain.  She states that her pain today is 10, it is sharp, dull, stabbing to aching type pain.  It is unchanged, but she feels like it has increased somewhat in intensity in the low back.  Pain is worse in the morning and evening.  Sleep patterns are poor.  Pain is worse with all activities.  Medication, TENS unit help some.  She can climb steps and drive.  She can walk about 15 minutes at a time.  Functionally, some help with bathing, household duties, and shopping.  REVIEW OF SYSTEMS:  Notable for difficulties as described above as well as some weight gain, night sweats, weakness, trouble walking, spasms, confusion, depression anxiety, limb swelling, abdominal pain, no suicidal thoughts, or aberrant behaviors.  Pill counts correct. Oswestry score is 68.  Last UDS was consistent.  PAST MEDICAL HISTORY, SOCIAL HISTORY, AND FAMILY HISTORY:  Unchanged.  PHYSICAL EXAMINATION:  VITAL SIGNS:  Her blood pressure is 155/93, pulse 89, respirations 18, O2 sats 100% on room air. MUSCULOSKELETAL:  Motor strength is 5/5 to confrontation testing in the lower extremities.  Sensation is intact. GENERAL:  Constitutionally, she is within normal limits. NEUROLOGIC:  She is alert and oriented x3, somewhat depressed, and walks with a limp.  ASSESSMENT: 1. Chronic low back pain. 2. Cervicalgia. 3. Right radicular pain, lower extremity.  PLAN: 1. She is down to one medicine now.  She has taken Opana, we are going     to refill that Opana ER 10 mg 1 p.o. b.i.d. 60 with no refill. 2. Toradol 60 mg to the left upper buttock area.  If that does not     help, we are going to possibly look into a lower extremity EMG     nerve conduction study. For injection, she is going to come back     and see Dr. Wynn Banker, next appointment.  Her pain distribution     appears to be in the L2-3 nerve root.   She does have some right     groin pain.  She has pain with internal-external rotation of the     right leg which could be the nerve root given the fact that her     last x-rays and MRI of the pelvis were normal.  We will see her     back in 4-6 weeks. Her questions were encouraged and answered.     Yosgar Demirjian L. Blima Dessert Electronically Signed    RLW/MedQ D:  05/29/2011 14:59:15  T:  05/30/2011 02:33:08  Job #:  [id number]

## 2011-06-30 ENCOUNTER — Ambulatory Visit: Payer: Medicare Other | Admitting: Physical Medicine & Rehabilitation

## 2014-04-23 ENCOUNTER — Other Ambulatory Visit: Payer: Self-pay | Admitting: Obstetrics and Gynecology

## 2014-04-24 LAB — CYTOLOGY - PAP

## 2015-05-12 DIAGNOSIS — M5030 Other cervical disc degeneration, unspecified cervical region: Secondary | ICD-10-CM

## 2015-05-12 HISTORY — DX: Other cervical disc degeneration, unspecified cervical region: M50.30

## 2015-11-25 ENCOUNTER — Encounter: Payer: Self-pay | Admitting: Physical Medicine & Rehabilitation

## 2016-12-25 ENCOUNTER — Other Ambulatory Visit: Payer: Self-pay | Admitting: Obstetrics & Gynecology

## 2016-12-25 DIAGNOSIS — N6320 Unspecified lump in the left breast, unspecified quadrant: Secondary | ICD-10-CM

## 2016-12-27 ENCOUNTER — Ambulatory Visit
Admission: RE | Admit: 2016-12-27 | Discharge: 2016-12-27 | Disposition: A | Discharge status: Home / Self Care | Payer: Medicare Other | Source: Ambulatory Visit | Attending: Obstetrics & Gynecology | Admitting: Obstetrics & Gynecology

## 2016-12-27 ENCOUNTER — Other Ambulatory Visit: Payer: Self-pay | Admitting: Obstetrics & Gynecology

## 2016-12-27 DIAGNOSIS — N6320 Unspecified lump in the left breast, unspecified quadrant: Secondary | ICD-10-CM

## 2016-12-27 HISTORY — DX: Unspecified lump in unspecified breast: N63.0

## 2017-06-07 DIAGNOSIS — R002 Palpitations: Secondary | ICD-10-CM

## 2017-09-20 ENCOUNTER — Encounter: Payer: Self-pay | Admitting: Internal Medicine

## 2017-09-28 ENCOUNTER — Ambulatory Visit: Payer: Medicare Other | Admitting: Internal Medicine

## 2017-10-11 NOTE — Progress Notes (Signed)
Cardiology Office Note   Date:  10/19/2017   ID:  Mallory Leonard, DOB 1961/07/17, MRN [id number]  PCP:  Caffie Damme, MD  Cardiologist:   Charlton Haws, MD   No chief complaint on file.      History of Present Illness: Mallory Leonard is a 57 y.o. female who presents for evaluation of elevated BP on 3 drugs. Also exertional dyspnea She has chronic pain syndrome and obesity On oxycodone and Adipex Phentermine stopped Her daughter died Unexpectedly in 08/03/2023 Was a travel nurse ? Pneumonia and respiratory collapse. She is raising her two children Has had palpitations for a month or so Not exertional more at night. No syncope. Dyspnea as well She seems to  Sigh and take a deep breath while talking. She has no asthma, non smoker No signs CHF or edema has cut meds back on her own for BP only on ACE not no norvasc or diuretic    LDL 122 HDL 78 TC 232 LDL-P 1022 A1c 5.9 TSH 2.9 K 4.4 Cr .92   Past Medical History:  Diagnosis Date  . Breast mass   . Chronic pain   . DDD (degenerative disc disease), cervical 05/12/2015   WITH RADICULAR PAIN INTO R SI JOINT.  Marland Kitchen Encounter for therapeutic drug level monitoring   . Obese     Past Surgical History:  Procedure Laterality Date  . CERVICAL FUSION  2008   WITH WORSENING OF NECK PAIN     Current Outpatient Medications  Medication Sig Dispense Refill  . atorvastatin (LIPITOR) 40 MG tablet Take 40 mg by mouth daily.    Marland Kitchen lisinopril (PRINIVIL,ZESTRIL) 20 MG tablet Take 20 mg by mouth daily.    . Oxycodone HCl 10 MG TABS Take 10 mg by mouth 4 (four) times daily as needed.     No current facility-administered medications for this visit.     Allergies:   Tylenol [acetaminophen]    Social History:  The patient  reports that  has never smoked. she has never used smokeless tobacco. She reports that she does not drink alcohol or use drugs.   Family History:  The patient's family history is not on file.    ROS:  Please see the history of  present illness.   Otherwise, review of systems are positive for none.   All other systems are reviewed and negative.    PHYSICAL EXAM: VS:  BP (!) 142/84   Pulse 72   Ht 5\' 6"  (1.676 m)   Wt 176 lb (79.8 kg)   SpO2 99%   BMI 28.41 kg/m  , BMI Body mass index is 28.41 kg/m. Affect appropriate Healthy:  appears stated age HEENT: normal Neck supple with no adenopathy JVP normal no bruits no thyromegaly Lungs clear with no wheezing and good diaphragmatic motion Heart:  S1/S2 no murmur, no rub, gallop or click PMI normal Abdomen: benighn, BS positve, no tenderness, no AAA no bruit.  No HSM or HJR Distal pulses intact with no bruits No edema Neuro non-focal Skin warm and dry No muscular weakness    EKG:  06/07/17 SR rate 68 normal    Recent Labs: No results found for requested labs within last 8760 hours.    Lipid Panel No results found for: CHOL, TRIG, HDL, CHOLHDL, VLDL, LDLCALC, LDLDIRECT    Wt Readings from Last 3 Encounters:  10/19/17 176 lb (79.8 kg)      Other studies Reviewed: Additional studies/ records that were reviewed today include: Notes  from primary labs .    ASSESSMENT AND PLAN:  1.  Palpitations benign sounding will get event monitor  2. Dsypnea normal ECG and exam f/u echo 3. Depression: likely from daughters untimely death and stress of bringing her two children up consider SSRI 4. HLD  Continue statin labs with primary   Current medicines are reviewed at length with the patient today.  The patient does not have concerns regarding medicines.  The following changes have been made:  no change  Labs/ tests ordered today include: Event monitor and echo   Orders Placed This Encounter  Procedures  . CARDIAC EVENT MONITOR  . EKG 12-Lead  . ECHOCARDIOGRAM COMPLETE     Disposition:   FU with cardiology PRN      Signed, Charlton HawsPeter Nishan, MD  10/19/2017 10:49 AM    [facility] [address], [place], [place]   [id number] Phone: [phone]; Fax: [phone]

## 2017-10-19 ENCOUNTER — Ambulatory Visit (INDEPENDENT_AMBULATORY_CARE_PROVIDER_SITE_OTHER): Payer: Medicare Other | Admitting: Cardiovascular Disease

## 2017-10-19 ENCOUNTER — Encounter (INDEPENDENT_AMBULATORY_CARE_PROVIDER_SITE_OTHER): Payer: Self-pay

## 2017-10-19 ENCOUNTER — Encounter: Payer: Self-pay | Admitting: Cardiovascular Disease

## 2017-10-19 VITALS — BP 142/84 | HR 72 | Ht 66.0 in | Wt 176.0 lb

## 2017-10-19 DIAGNOSIS — R002 Palpitations: Secondary | ICD-10-CM

## 2017-10-19 DIAGNOSIS — Z5181 Encounter for therapeutic drug level monitoring: Secondary | ICD-10-CM | POA: Insufficient documentation

## 2017-10-19 DIAGNOSIS — R0600 Dyspnea, unspecified: Secondary | ICD-10-CM

## 2017-10-19 DIAGNOSIS — N63 Unspecified lump in unspecified breast: Secondary | ICD-10-CM | POA: Insufficient documentation

## 2017-10-19 DIAGNOSIS — E669 Obesity, unspecified: Secondary | ICD-10-CM | POA: Insufficient documentation

## 2017-10-19 DIAGNOSIS — G8929 Other chronic pain: Secondary | ICD-10-CM | POA: Insufficient documentation

## 2017-10-19 NOTE — Patient Instructions (Signed)
Medication Instructions:  Your physician recommends that you continue on your current medications as directed. Please refer to the Current Medication list given to you today.  Labwork: NONE  Testing/Procedures: Your physician has requested that you have an echocardiogram. Echocardiography is a painless test that uses sound waves to create images of your heart. It provides your doctor with information about the size and shape of your heart and how well your heart's chambers and valves are working. This procedure takes approximately one hour. There are no restrictions for this procedure.  Your physician has recommended that you wear an event monitor. Event monitors are medical devices that record the heart's electrical activity. Doctors most often us these monitors to diagnose arrhythmias. Arrhythmias are problems with the speed or rhythm of the heartbeat. The monitor is a small, portable device. You can wear one while you do your normal daily activities. This is usually used to diagnose what is causing palpitations/syncope (passing out).  Follow-Up: Your physician wants you to follow-up as needed with Dr. Eden EmmsNishan.   If you need a refill on your cardiac medications before your next appointment, please call your pharmacy.

## 2017-10-31 ENCOUNTER — Other Ambulatory Visit (HOSPITAL_COMMUNITY): Payer: Medicare Other

## 2017-11-22 ENCOUNTER — Other Ambulatory Visit: Payer: Self-pay

## 2017-11-22 ENCOUNTER — Ambulatory Visit (INDEPENDENT_AMBULATORY_CARE_PROVIDER_SITE_OTHER): Payer: Medicare Other

## 2017-11-22 ENCOUNTER — Ambulatory Visit (HOSPITAL_COMMUNITY): Payer: Medicare Other | Attending: Cardiology

## 2017-11-22 ENCOUNTER — Telehealth: Payer: Self-pay

## 2017-11-22 DIAGNOSIS — I081 Rheumatic disorders of both mitral and tricuspid valves: Secondary | ICD-10-CM | POA: Diagnosis not present

## 2017-11-22 DIAGNOSIS — R002 Palpitations: Secondary | ICD-10-CM | POA: Diagnosis not present

## 2017-11-22 DIAGNOSIS — R0600 Dyspnea, unspecified: Secondary | ICD-10-CM

## 2017-11-22 NOTE — Telephone Encounter (Signed)
   [facility] Pre-operative Risk Assessment    Request for surgical clearance:  1. What type of surgery is being performed? Dental treatment- may include cleaning, radiographs, fillings and extractions   2. When is this surgery scheduled? None at this time.     3. What type of clearance is required (medical clearance vs. Pharmacy clearance to hold med vs. Both)? Medical clearance  4. Are there any medications that need to be held prior to surgery and how long? Antibiotic prophylaxis (yes or no) Interruption of anticoagulants (yes or no)  Anesthetic Restrictions (yes or no)  Is Epinephrine okay (yes or no)  5. Practice name and name of physician performing surgery? [facility], Dr. Alcide Clever   6. What is your office phone and fax number? Phone- [phone] [phone]   Anesthesia type (None, local, MAC, general) ? Local with epinephrine  Mallory Leonard 11/22/2017, 2:10 PM  _________________________________________________________________ On request it states- "Patient has indicated the following medical conditions: 1-Patient reported dx of uncontrolled angina. 2- Patient on nitroglycerin Rx?

## 2017-11-22 NOTE — Telephone Encounter (Signed)
    Patient seen by Dr Eden EmmsNishan is a new patient 10/19/2017.  Echocardiogram and event monitor ordered.  Results on these tests are not available at this time.  Follow-up on the testing results.   Ask Dr Eden EmmsNishan to address cardiac clearance once test results are available.  Clearance is for dental procedures requiring local anesthesia with epinephrine.  Theodore Demarkhonda Rillie Riffel, PA-C 11/22/2017 5:21 PM Beeper [phone]

## 2017-11-23 NOTE — Telephone Encounter (Signed)
Faxed clearance to fax number provided.

## 2017-11-23 NOTE — Telephone Encounter (Signed)
She is ok to have dental procedure Echo was normal

## 2018-10-28 ENCOUNTER — Other Ambulatory Visit: Payer: Self-pay | Admitting: Orthopaedic Surgery

## 2018-10-28 ENCOUNTER — Telehealth: Payer: Self-pay | Admitting: Nurse Practitioner

## 2018-10-28 DIAGNOSIS — M542 Cervicalgia: Secondary | ICD-10-CM

## 2018-10-28 NOTE — Telephone Encounter (Signed)
Phone call to patient to verify medication list and allergies for myelogram procedure. Pt aware she will not need to hold any medications for this procedure. 

## 2018-11-08 ENCOUNTER — Other Ambulatory Visit: Payer: Medicare Other

## 2018-12-13 ENCOUNTER — Inpatient Hospital Stay: Admission: RE | Admit: 2018-12-13 | Payer: Medicare Other | Source: Ambulatory Visit

## 2019-01-06 ENCOUNTER — Telehealth: Payer: Self-pay | Admitting: Nurse Practitioner

## 2019-01-06 NOTE — Telephone Encounter (Signed)
Phone call to patient to verify medication list and allergies for myelogram procedure. Pt aware she will not need to hold any medications for this procedure. 

## 2019-02-10 ENCOUNTER — Other Ambulatory Visit: Payer: Medicare Other

## 2019-02-12 ENCOUNTER — Other Ambulatory Visit: Payer: Medicare Other

## 2019-03-03 ENCOUNTER — Other Ambulatory Visit: Payer: Medicare Other

## 2019-05-05 ENCOUNTER — Ambulatory Visit
Admission: RE | Admit: 2019-05-05 | Discharge: 2019-05-05 | Disposition: A | Discharge status: Home / Self Care | Payer: Medicare Other | Source: Ambulatory Visit | Attending: Orthopaedic Surgery | Admitting: Orthopaedic Surgery

## 2019-05-05 DIAGNOSIS — M542 Cervicalgia: Secondary | ICD-10-CM

## 2019-05-05 MED ORDER — ONDANSETRON HCL 4 MG/2ML IJ SOLN
4.0000 mg | Freq: Once | INTRAMUSCULAR | Status: AC
  Administered 2019-05-05: 10:00:00 4 mg via INTRAMUSCULAR

## 2019-05-05 MED ORDER — IOPAMIDOL (ISOVUE-M 300) INJECTION 61%
10.0000 mL | Freq: Once | INTRAMUSCULAR | Status: AC | PRN
  Administered 2019-05-05: 10:00:00 10 mL via INTRATHECAL

## 2019-05-05 MED ORDER — DIAZEPAM 5 MG PO TABS
10.0000 mg | ORAL_TABLET | Freq: Once | ORAL | Status: AC
  Administered 2019-05-05: 09:00:00 10 mg via ORAL

## 2019-05-05 MED ORDER — MEPERIDINE HCL 50 MG/ML IJ SOLN
50.0000 mg | Freq: Once | INTRAMUSCULAR | Status: AC
  Administered 2019-05-05: 10:00:00 50 mg via INTRAMUSCULAR

## 2019-05-05 NOTE — Discharge Instructions (Signed)

## 2019-05-05 NOTE — Discharge Instr - Other Orders (Signed)
0945- pt reports achy pain in back and neck from procedure. MD ordered Pain meds. See MAR 1000- pt reports pain has subsided. Resting comfortably.

## 2019-12-15 ENCOUNTER — Other Ambulatory Visit: Payer: Self-pay | Admitting: Oncology

## 2020-06-24 ENCOUNTER — Emergency Department (HOSPITAL_COMMUNITY)
Admission: EM | Admit: 2020-06-24 | Discharge: 2020-06-24 | Disposition: A | Discharge status: Home / Self Care | Payer: Medicare Other | Attending: Emergency Medicine | Admitting: Emergency Medicine

## 2020-06-24 ENCOUNTER — Emergency Department (HOSPITAL_COMMUNITY): Payer: Medicare Other

## 2020-06-24 DIAGNOSIS — R002 Palpitations: Secondary | ICD-10-CM | POA: Diagnosis not present

## 2020-06-24 DIAGNOSIS — R350 Frequency of micturition: Secondary | ICD-10-CM | POA: Diagnosis not present

## 2020-06-24 DIAGNOSIS — R0789 Other chest pain: Secondary | ICD-10-CM | POA: Diagnosis not present

## 2020-06-24 DIAGNOSIS — R079 Chest pain, unspecified: Secondary | ICD-10-CM

## 2020-06-24 DIAGNOSIS — R319 Hematuria, unspecified: Secondary | ICD-10-CM | POA: Insufficient documentation

## 2020-06-24 DIAGNOSIS — R110 Nausea: Secondary | ICD-10-CM | POA: Diagnosis not present

## 2020-06-24 DIAGNOSIS — R519 Headache, unspecified: Secondary | ICD-10-CM | POA: Insufficient documentation

## 2020-06-24 LAB — CBC
HCT: 43.4 % (ref 36.0–46.0)
Hemoglobin: 14.5 g/dL (ref 12.0–15.0)
MCH: 30.5 pg (ref 26.0–34.0)
MCHC: 33.4 g/dL (ref 30.0–36.0)
MCV: 91.2 fL (ref 80.0–100.0)
Platelets: 276 10*3/uL (ref 150–400)
RBC: 4.76 MIL/uL (ref 3.87–5.11)
RDW: 13 % (ref 11.5–15.5)
WBC: 9.1 10*3/uL (ref 4.0–10.5)
nRBC: 0 % (ref 0.0–0.2)

## 2020-06-24 LAB — BASIC METABOLIC PANEL
Anion gap: 12 (ref 5–15)
BUN: 18 mg/dL (ref 6–20)
CO2: 26 mmol/L (ref 22–32)
Calcium: 9.4 mg/dL (ref 8.9–10.3)
Chloride: 102 mmol/L (ref 98–111)
Creatinine, Ser: 0.88 mg/dL (ref 0.44–1.00)
GFR, Estimated: >60 mL/min (ref >60)
Glucose, Bld: 101 mg/dL — ABNORMAL HIGH (ref 70–99)
Potassium: 3.8 mmol/L (ref 3.5–5.1)
Sodium: 140 mmol/L (ref 135–145)

## 2020-06-24 LAB — TROPONIN I (HIGH SENSITIVITY)
Troponin I (High Sensitivity): 5 ng/L (ref <18)
Troponin I (High Sensitivity): 4 ng/L (ref <18)

## 2020-06-24 MED ORDER — ONDANSETRON HCL 4 MG PO TABS: 4.0000 mg | ORAL_TABLET | Freq: Three times a day (TID) | ORAL | 0 refills | Status: AC | PRN

## 2020-06-24 MED ORDER — MORPHINE SULFATE (PF) 4 MG/ML IV SOLN: 4.0000 mg | Freq: Once | INTRAVENOUS | Status: DC

## 2020-06-24 MED ORDER — KETOROLAC TROMETHAMINE 30 MG/ML IJ SOLN
30.0000 mg | Freq: Once | INTRAMUSCULAR | Status: DC
  Filled 2020-06-24: qty 1

## 2020-06-24 MED ORDER — IBUPROFEN 600 MG PO TABS: 600.0000 mg | ORAL_TABLET | Freq: Four times a day (QID) | ORAL | 0 refills | Status: AC | PRN

## 2020-06-24 MED ORDER — IBUPROFEN 800 MG PO TABS
800.0000 mg | ORAL_TABLET | Freq: Once | ORAL | Status: AC
  Administered 2020-06-24: 800 mg via ORAL
  Filled 2020-06-24: qty 1

## 2020-06-24 NOTE — ED Provider Notes (Signed)
[facility] EMERGENCY DEPARTMENT Provider Note   CSN: [id number] Arrival date & time: 06/24/20  1033     History Chief Complaint  Patient presents with  . Headache  . Nausea  . Chest Pain    Mallory Leonard is a 59 y.o. female.  The history is provided by the patient. No language interpreter was used.  Headache Chest Pain Associated symptoms: headache      59 year old female significant history of obesity, chronic pain, breast mass, presenting for evaluation of chest pain. Patient report for the past 2 days she has had intermittent urinary discomfort with increased urinary frequency and urgency. She also endorsed having intermittent heart palpitation that lasted for seconds intermittently for the past 2 days as well. This morning she was awoke with headache. She described headache as a throbbing sensation starting the right side of her neck radiates towards the head initially more intense but that has since improved. She does endorse feeling nauseous without any vision changes fever chills runny nose sneezing coughing shortness of breath lightheadedness dizziness having focal numbness or focal weakness. She went to her primary care doctor's office for a schedule yearly exam but did vomit once there. She was given Zofran and was sent here for further evaluation. At this time she mention her headache is 4 out of 10. She also endorsed some mild tenderness to the right side of her abdomen. States she has history of kidney stones in the past but have not noticed any blood in her urine. She does take pain medication on a regular basis for her chronic neck pain. She denies any significant cardiac history. She does not smoke or drink.  Past Medical History:  Diagnosis Date  . Breast mass   . Chronic pain   . DDD (degenerative disc disease), cervical 05/12/2015   WITH RADICULAR PAIN INTO R SI JOINT.  Marland Kitchen Encounter for therapeutic drug level monitoring   . Obese     Patient  Active Problem List   Diagnosis Date Noted  . Obese   . Encounter for therapeutic drug level monitoring   . Chronic pain   . Breast mass   . DDD (degenerative disc disease), cervical 05/12/2015    Past Surgical History:  Procedure Laterality Date  . CERVICAL FUSION  2008   WITH WORSENING OF NECK PAIN     OB History   No obstetric history on file.     No family history on file.  Social History   Tobacco Use  . Smoking status: Never Smoker  . Smokeless tobacco: Never Used  Vaping Use  . Vaping Use: Never used  Substance Use Topics  . Alcohol use: No  . Drug use: No    Home Medications Prior to Admission medications   Medication Sig Start Date End Date Taking? Authorizing Provider  atorvastatin (LIPITOR) 40 MG tablet Take 40 mg by mouth daily.    [provider]  lisinopril (PRINIVIL,ZESTRIL) 20 MG tablet Take 20 mg by mouth daily.    [provider]  Oxycodone HCl 10 MG TABS Take 10 mg by mouth 4 (four) times daily as needed.    [provider]    Allergies    Tylenol [acetaminophen]  Review of Systems   Review of Systems  Cardiovascular: Positive for chest pain.  Neurological: Positive for headaches.  All other systems reviewed and are negative.   Physical Exam Updated Vital Signs BP (!) 162/60 (BP Location: Left Arm)   Pulse 63  Temp 98.4 F (36.9 C) (Oral)   Resp 18   SpO2 96%   Physical Exam Vitals and nursing note reviewed.  Constitutional:      General: She is not in acute distress.    Appearance: She is well-developed.     Comments: Patient resting comfortably appears to be in no acute discomfort.  HENT:     Head: Normocephalic and atraumatic.  Eyes:     Extraocular Movements: Extraocular movements intact.     Conjunctiva/sclera: Conjunctivae normal.     Pupils: Pupils are equal, round, and reactive to light.  Neck:     Comments: No tenderness to palpation of the neck. No carotid bruit. Cardiovascular:      Rate and Rhythm: Normal rate and regular rhythm.     Heart sounds: Normal heart sounds.  Pulmonary:     Effort: Pulmonary effort is normal.     Breath sounds: Normal breath sounds. No wheezing, rhonchi or rales.  Abdominal:     General: Bowel sounds are normal.     Tenderness: There is abdominal tenderness (Mild tenderness to right upper abdomen on palpation without guarding or rebound tenderness. Negative Murphy sign, no pain with McBurney's point).     Comments: No CVA tenderness  Musculoskeletal:     Cervical back: Neck supple. No rigidity.  Skin:    Findings: No rash.  Neurological:     Mental Status: She is alert and oriented to person, place, and time.     GCS: GCS eye subscore is 4. GCS verbal subscore is 5. GCS motor subscore is 6.     Cranial Nerves: No dysarthria.     Sensory: No sensory deficit.     Comments: 5 out of 5 to all 4 extremities.  Psychiatric:        Mood and Affect: Mood normal.     ED Results / Procedures / Treatments   Labs (all labs ordered are listed, but only abnormal results are displayed) Labs Reviewed  BASIC METABOLIC PANEL - Abnormal; Notable for the following components:      Result Value   Glucose, Bld 101 (*)    All other components within normal limits  CBC  URINALYSIS, ROUTINE W REFLEX MICROSCOPIC  TROPONIN I (HIGH SENSITIVITY)  TROPONIN I (HIGH SENSITIVITY)    EKG EKG Interpretation  Date/Time:  Thursday June 24 2020 10:51:31 EDT Ventricular Rate:  68 PR Interval:  150 QRS Duration: 70 QT Interval:  406 QTC Calculation: 431 R Axis:   68 Text Interpretation: Sinus rhythm with marked sinus arrhythmia Nonspecific ST abnormality Abnormal ECG no prior ECG for comparison. No STEMI Confirmed by Theda Belfast ([id number]) on 06/24/2020 11:46:11 AM   Radiology DG Chest 2 View  Result Date: 06/24/2020 CLINICAL DATA:  Chest pain. EXAM: CHEST - 2 VIEW COMPARISON:  July 04, 2012. FINDINGS: The heart size and mediastinal contours are  within normal limits. Both lungs are clear. No visible pleural effusions or pneumothorax. No acute osseous abnormality. Suspected remote/healed right clavicle fracture. Cervical ACDF. IMPRESSION: No active cardiopulmonary disease. Electronically Signed   By: Feliberto Harts MD   On: 06/24/2020 11:12    Procedures Procedures (including critical care time)  Medications Ordered in ED Medications  ibuprofen (ADVIL) tablet 800 mg (800 mg Oral Given 06/24/20 1340)    ED Course  I have reviewed the triage vital signs and the nursing notes.  Pertinent labs & imaging results that were available during my care of the patient were reviewed by me  and considered in my medical decision making (see chart for details).    MDM Rules/Calculators/A&P                          BP (!) 162/60 (BP Location: Left Arm)   Pulse 63   Temp 98.4 F (36.9 C) (Oral)   Resp 18   SpO2 96%   Final Clinical Impression(s) / ED Diagnoses Final diagnoses:  Atypical chest pain  Hematuria, unspecified type    Rx / DC Orders ED Discharge Orders         Ordered    ondansetron (ZOFRAN) 4 MG tablet  Every 8 hours PRN        06/24/20 1342    ibuprofen (ADVIL) 600 MG tablet  Every 6 hours PRN        06/24/20 1342         10:56 AM Patient here with complaints of dysuria as well as having headache, and having intermittent chest discomfort. At this time she is well-appearing appears to be in no acute discomfort. No concerning feature in regards to headache. Chest pain sounds atypical of ACS. Work-up initiated.  1:00 PM UA from urgent care center demonstrate evidence of blood in the urine without signs of urinary tract infection.  Since patient does have some pain to her right side of abdomen possible right flank, will get CT renal stone study to rule out kidney stone.  Has history of kidney stones in the past.  Her EKG here shows sinus rhythm with marked sinus arrhythmia and nonspecific ST abnormalities.  Her initial  troponin is unremarkable.  Awaits delta troponin.  Pain is atypical of ACS.  She has headache without any focal neuro deficit.  Doubt dissection.  Doubt ICH, SAH, or stroke.  Pain medication given.  Care discussed with Dr. Rush Landmark.   1:33 PM I did order a CT renal stone study however patient declined stating that she does not feel that the CT scan is necessary at this time as her pain has improved.  She would prefer to follow-up with her primary care doctor for further care.  She is allergic to Tylenol therefore will give NSAIDs to help her with her symptoms.  Patient is then to return if her condition worsen or if she has any other concern.   Fayrene Helper, PA-C 06/24/20 1345    Tegeler, Canary Brim, MD 06/24/20 1544

## 2020-06-24 NOTE — ED Notes (Signed)
Episode of urinary incontinence while actively vomiting.  Pt changed out of wet clothes and bedding changed.  Pt states wave of nausea has passed after vomiting

## 2020-06-24 NOTE — Discharge Instructions (Addendum)
You have been evaluated for your chest pain.  EKG shows that he has some slightly abnormal heart rhythm/arrhythmia which may account to your heart palpitation.  Follow-up with your primary care doctor for further evaluation.  Your urine did show evidence of blood in the urine, coupled with abdominal discomfort this may be a result of a potential kidney stone.  Talk to your doctor and request for CT scan if your symptoms persist.  Take ibuprofen as needed for pain and Zofran as needed for nausea.  If you develop worsening symptoms please do not hesitate to return to the ER for further evaluation.  Your blood pressure is high today, please have it rechecked.

## 2020-06-24 NOTE — ED Triage Notes (Signed)
Pt arrived by EMS from Urgent care where she has a scheduled appointment with her PCP for chest pain that she has been having intermittently.  This morning was woken up with 7/10 headache that is located on the right side. Pt also c/o nausea with 2 episodes of emisis   Pt denies chest pain at this time.  Was given 4mg  Zofran at UC that helped the nausea.  Alert and oriented x4

## 2021-05-30 ENCOUNTER — Other Ambulatory Visit: Payer: Self-pay | Admitting: Obstetrics & Gynecology

## 2021-05-31 ENCOUNTER — Other Ambulatory Visit: Payer: Self-pay | Admitting: Obstetrics & Gynecology

## 2021-05-31 DIAGNOSIS — M79629 Pain in unspecified upper arm: Secondary | ICD-10-CM

## 2021-08-04 ENCOUNTER — Other Ambulatory Visit: Payer: Medicare Other

## 2022-09-08 IMAGING — CR DG CHEST 2V
2 series · 2 of 2 positions shown · non-contrast
Comparison: July 04, 2012.

CLINICAL DATA: Chest pain.

EXAM:
CHEST - 2 VIEW

[chest pa]
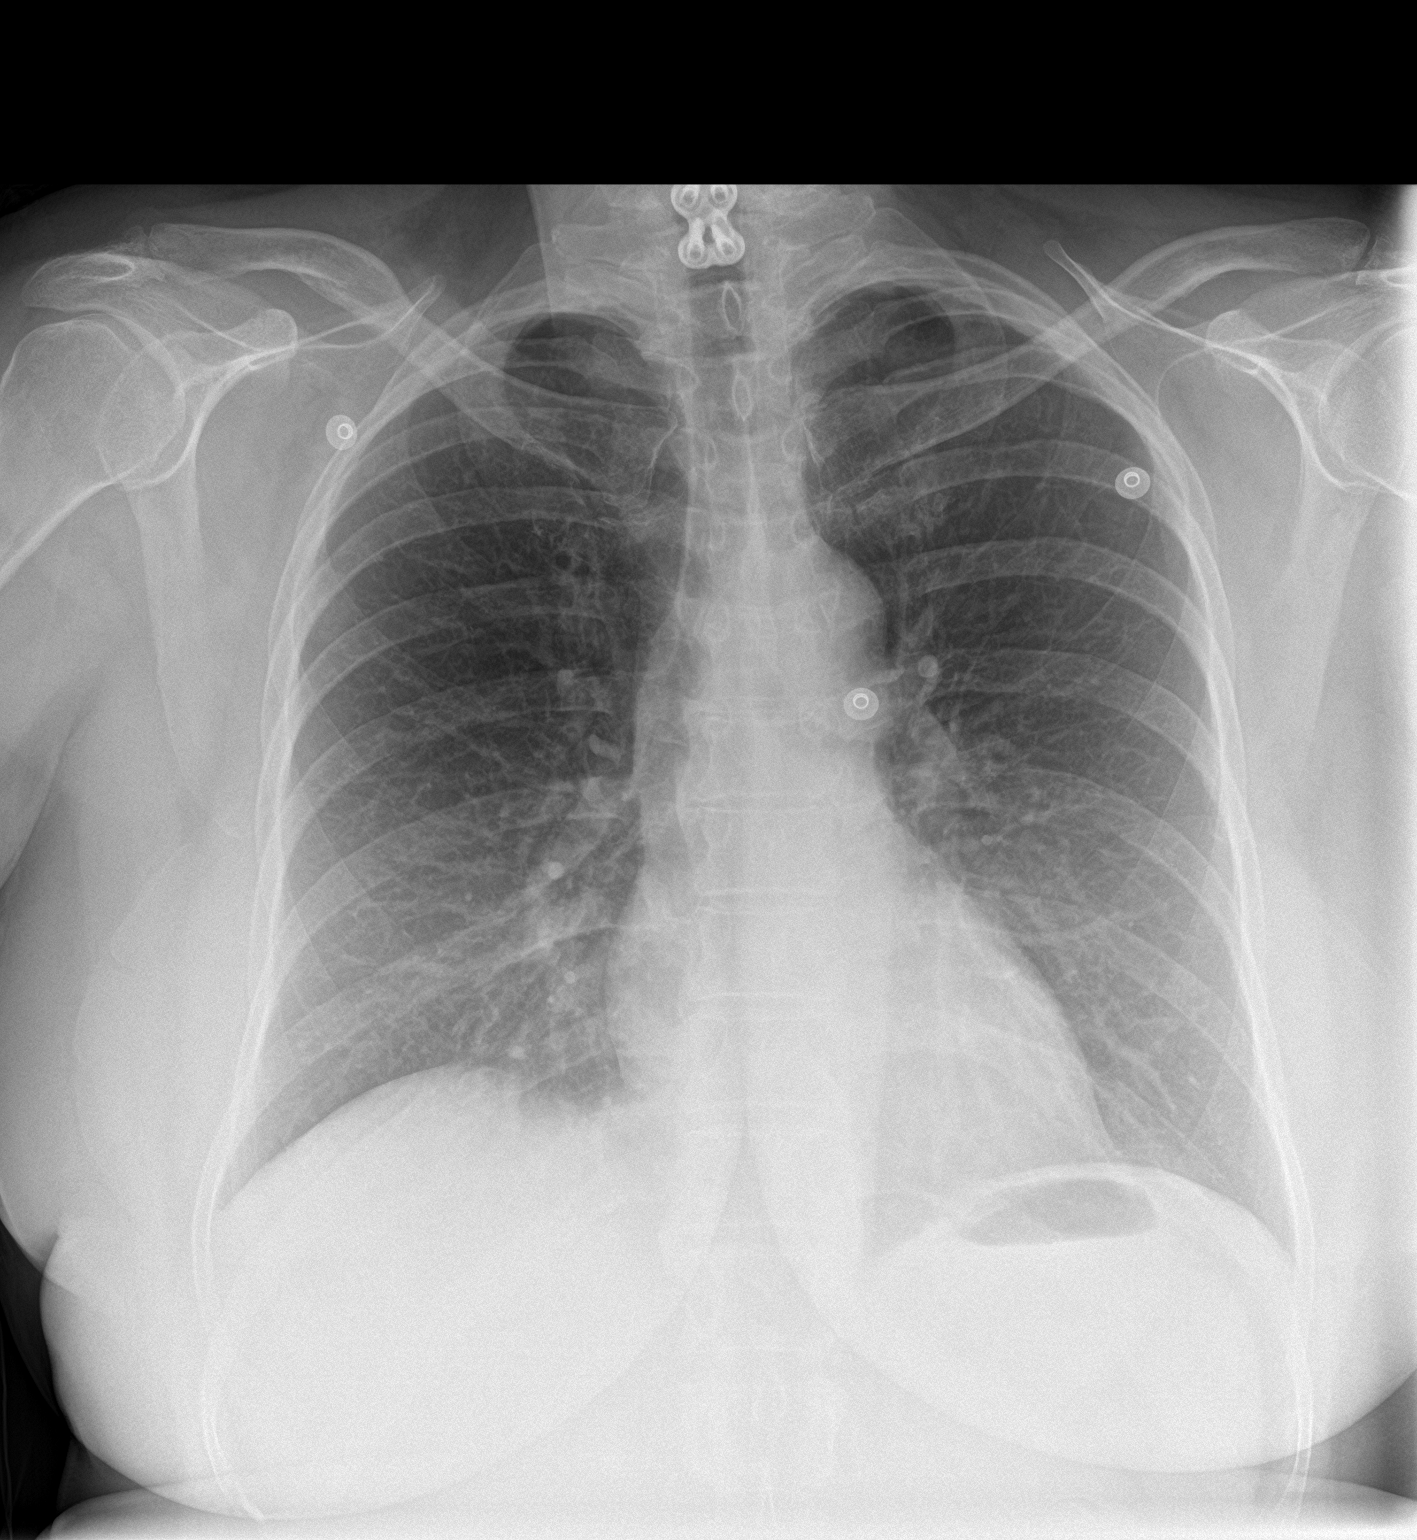

[chest lat]
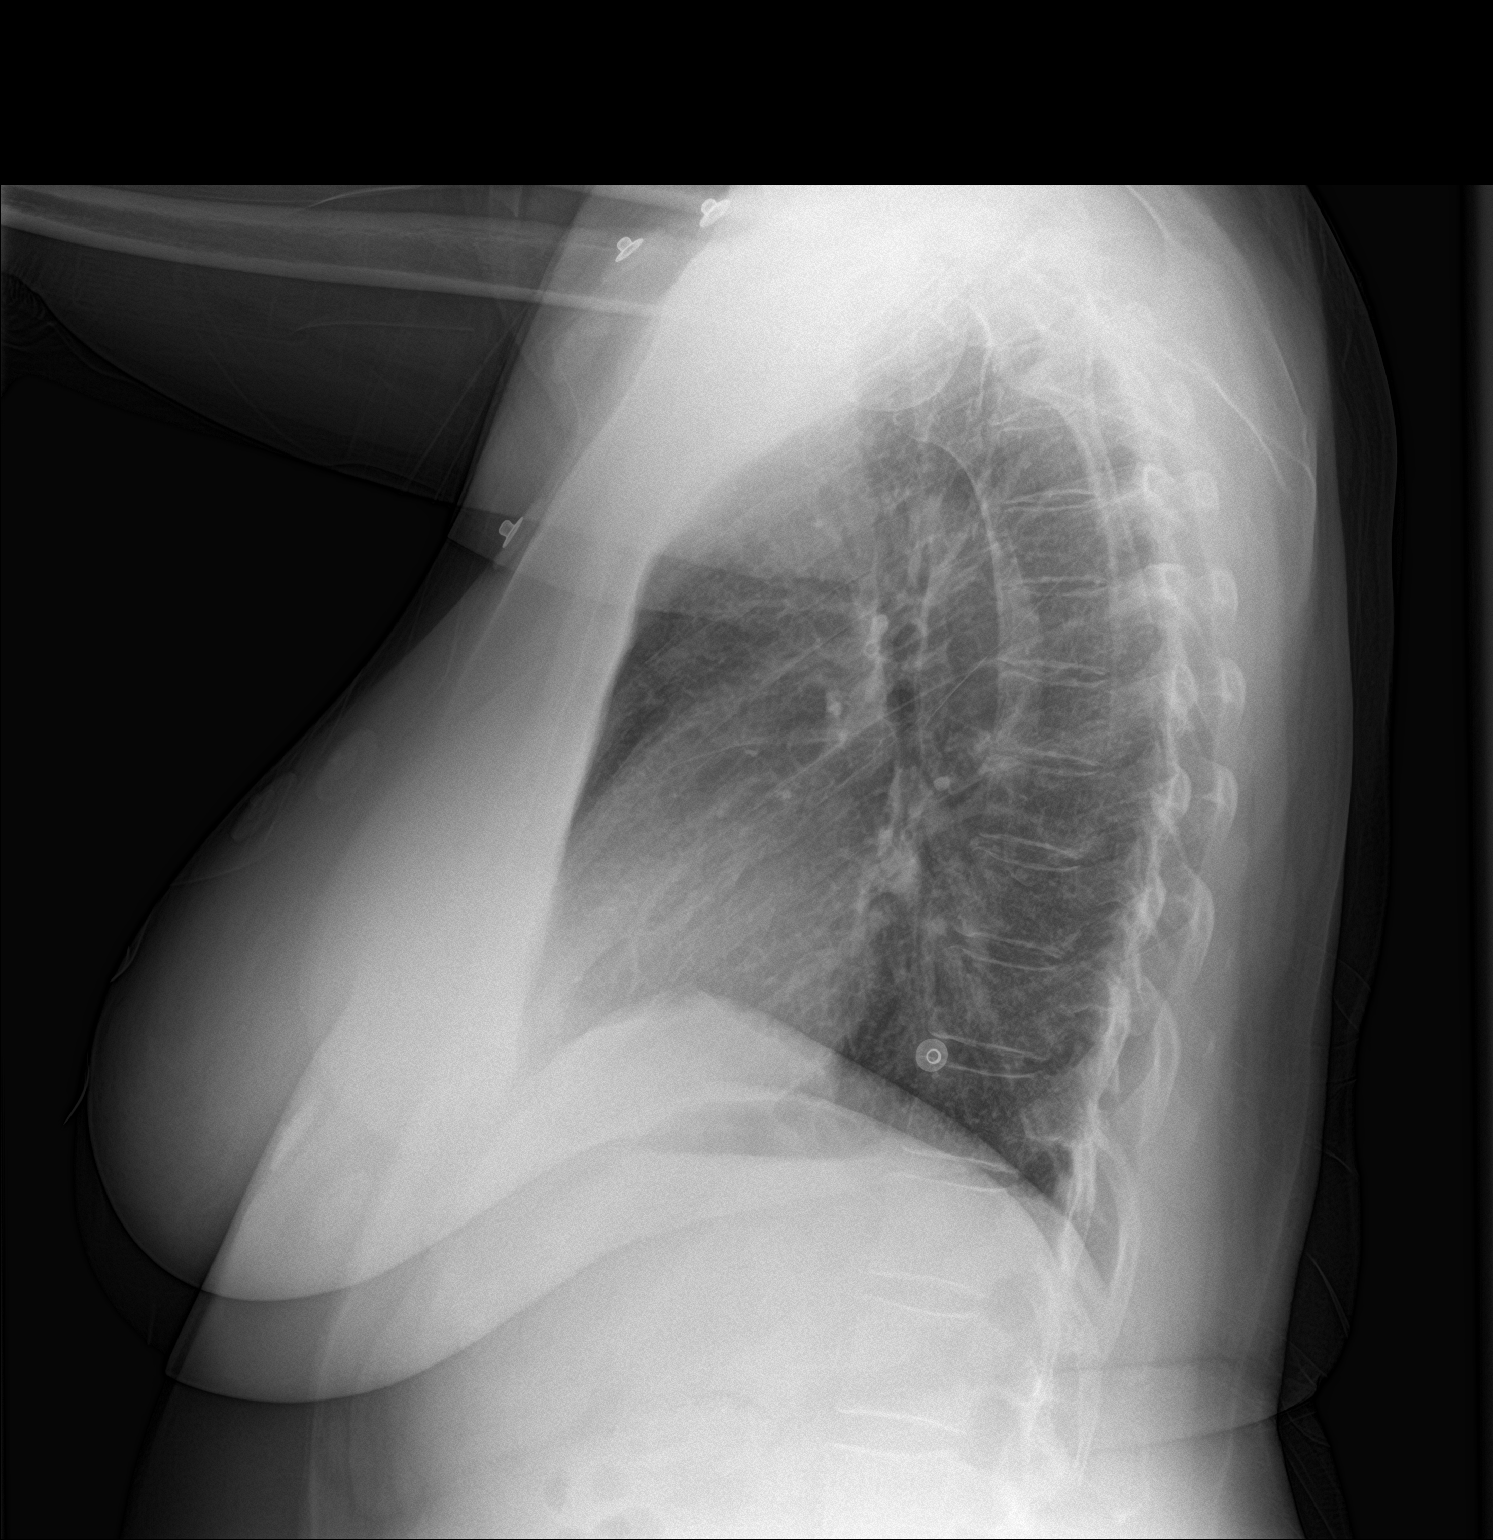

[2 of 2 positions shown; findings below may reference images not displayed]

FINDINGS: The heart size and mediastinal contours are within normal limits.
Both lungs are clear. No visible pleural effusions or pneumothorax.
No acute osseous abnormality. Suspected remote/healed right clavicle
fracture. Cervical ACDF.
IMPRESSION: No active cardiopulmonary disease.

## 2022-12-25 ENCOUNTER — Other Ambulatory Visit: Payer: Self-pay | Admitting: Obstetrics & Gynecology

## 2022-12-25 DIAGNOSIS — Z1231 Encounter for screening mammogram for malignant neoplasm of breast: Secondary | ICD-10-CM

## 2023-01-31 ENCOUNTER — Other Ambulatory Visit: Payer: Self-pay | Admitting: Obstetrics & Gynecology

## 2023-01-31 ENCOUNTER — Ambulatory Visit
Admission: RE | Admit: 2023-01-31 | Discharge: 2023-01-31 | Disposition: A | Discharge status: Home / Self Care | Payer: 59 | Source: Ambulatory Visit | Attending: Obstetrics & Gynecology | Admitting: Obstetrics & Gynecology

## 2023-01-31 DIAGNOSIS — Z1231 Encounter for screening mammogram for malignant neoplasm of breast: Secondary | ICD-10-CM

## 2023-01-31 DIAGNOSIS — N63 Unspecified lump in unspecified breast: Secondary | ICD-10-CM

## 2023-02-20 ENCOUNTER — Ambulatory Visit
Admission: RE | Admit: 2023-02-20 | Discharge: 2023-02-20 | Disposition: A | Discharge status: Home / Self Care | Payer: 59 | Source: Ambulatory Visit | Attending: Obstetrics & Gynecology | Admitting: Obstetrics & Gynecology

## 2023-02-20 DIAGNOSIS — N63 Unspecified lump in unspecified breast: Secondary | ICD-10-CM
